# Patient Record
Sex: Male | Born: 1995 | Race: Asian | Hispanic: No | Marital: Single | Smoking: Never smoker
Health system: Southern US, Community
[De-identification: ages and names within clinical notes are randomized; demographics above are authoritative.]

---
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
## 2015-01-16 ENCOUNTER — Encounter (HOSPITAL_COMMUNITY): Payer: Self-pay | Admitting: Emergency Medicine

## 2015-01-16 ENCOUNTER — Emergency Department (HOSPITAL_COMMUNITY)
Admission: EM | Admit: 2015-01-16 | Discharge: 2015-01-16 | Disposition: A | Discharge status: Home / Self Care | Payer: Self-pay | Attending: Emergency Medicine | Admitting: Emergency Medicine

## 2015-01-16 DIAGNOSIS — L237 Allergic contact dermatitis due to plants, except food: Secondary | ICD-10-CM | POA: Insufficient documentation

## 2015-01-16 MED ORDER — PREDNISONE 20 MG PO TABS: ORAL_TABLET | ORAL | Status: AC

## 2015-01-16 MED ORDER — TRIAMCINOLONE ACETONIDE 0.025 % EX OINT: 1.0000 "application " | TOPICAL_OINTMENT | Freq: Two times a day (BID) | CUTANEOUS | Status: AC

## 2015-01-16 NOTE — Discharge Instructions (Signed)

## 2015-01-16 NOTE — ED Provider Notes (Signed)
CSN: [id number]     Arrival date & time 01/16/15  1351 History  This chart was scribed for Doug SouSam Jacubowitz, MD by Roxy Cedarhandni Bhalodia, ED Scribe. This patient was seen in room WTR8/WTR8 and the patient's care was started at 2:46 PM.   Chief Complaint  Patient presents with  . Insect Bite   Patient is a 19 y.o. male presenting with rash. The history is provided by the patient. No language interpreter was used.  Rash Location:  Shoulder/arm Shoulder/arm rash location:  R forearm Quality: blistering, draining and itchiness   Severity:  Moderate Onset quality:  Gradual Timing:  Constant Progression:  Spreading Chronicity:  New Context: plant contact   Context comment:  Poison ivy Relieved by:  Nothing Worsened by:  Nothing tried Ineffective treatments:  None tried  HPI Comments: Dustin James is a 19 y.o. male with no chronic medical conditions, who presents to the Emergency Department complaining of assumed spider bite to the back of his right arm that occurred nights ago while patient was sleeping. He states that the affected area from bite has a darkened center. Patient states that it began as a "black dot" and gradually increased in size and swelling. He reports associated itchiness. He states that he has been cleaning affected area with tea tree oil and alcohol wipes for the past 2 days. Upon evaluation, patient states that he was playing with a pet snake that is let outdoors. He states that he had wrapped the snake around his right arm which may have caused poison ivy exposure.   History reviewed. No pertinent past medical history. History reviewed. No pertinent past surgical history. History reviewed. No pertinent family history. History  Substance Use Topics  . Smoking status: Not on file  . Smokeless tobacco: Not on file  . Alcohol Use: Not on file   Review of Systems  Skin: Positive for rash.  All other systems reviewed and are negative.  Allergies  Review of patient's  allergies indicates no known allergies.  Home Medications   Prior to Admission medications   Medication Sig Start Date End Date Taking? Authorizing Provider  predniSONE (DELTASONE) 20 MG tablet 3 tabs po daily x 3 days, then 2 tabs x 3 days, then 1.5 tabs x 3 days, then 1 tab x 3 days, then 0.5 tabs x 3 days 01/16/15   Arthor CaptainAbigail Kristyn Obyrne, PA-C  triamcinolone (KENALOG) 0.025 % ointment Apply 1 application topically 2 (two) times daily. Do not apply to face 01/16/15   Arthor CaptainAbigail Evelyna Folker, PA-C   Triage Vitals: BP 105/57 mmHg  Pulse 71  Temp(Src) 98.4 F (36.9 C) (Oral)  Resp 18  SpO2 100%  Physical Exam  Constitutional: He is oriented to person, place, and time. He appears well-developed and well-nourished. No distress.  HENT:  Head: Normocephalic and atraumatic.  Neck: Normal range of motion.  Cardiovascular: Normal rate.   Pulmonary/Chest: Effort normal. No respiratory distress.  Musculoskeletal: Normal range of motion. He exhibits no edema or tenderness.  Neurological: He is alert and oriented to person, place, and time. No cranial nerve deficit. Coordination normal.  Skin: Rash noted. He is not diaphoretic. There is erythema.  Right forearm: 4cm patch of confluent vesicles with crusting and central scabbing. It is non tender to palpation. There are surrounding linear streaks of vesicular eruption. Some singular, some confluent in various states of eruption. Appears consistent with contact dermatitis.  Psychiatric: He has a normal mood and affect. His behavior is normal.  Nursing note and  vitals reviewed.  ED Course  Procedures (including critical care time)  DIAGNOSTIC STUDIES: Oxygen Saturation is 100% on RA, normal by my interpretation.    COORDINATION OF CARE: 3:06 PM- Discussed with patient that rash is due to poison ivy not spider bite. Discussed plans to prescribe patient prednisone  tablets and topical ointment. Pt advised of plan for treatment and pt agrees.  Labs  Review Labs Reviewed - No data to display  Imaging Review No results found.   EKG Interpretation None     MDM   Final diagnoses:  Poison ivy dermatitis    Patient with contact dermatitis. Prednisone. And kenalog at discharge. No secondary infection.   I personally performed the services described in this documentation, which was scribed in my presence. The recorded information has been reviewed and is accurate.     Arthor Captain, PA-C 01/24/15 1924  Doug Sou, MD 01/25/15 [id number]

## 2015-01-16 NOTE — ED Notes (Signed)
Pt states he was bitten by a spider on the back of his right arm in his sleep. Over the last two days has been cleaning area with tea tree oil and alcohol wipes. The area now has petichiae and raised irritated skin surrounding a central darkened scabbed area. Pt states it has been draining yellow fluid occasionally.

## 2015-06-01 ENCOUNTER — Encounter (HOSPITAL_COMMUNITY): Payer: Self-pay | Admitting: Emergency Medicine

## 2016-09-13 ENCOUNTER — Emergency Department (HOSPITAL_COMMUNITY)
Admission: EM | Admit: 2016-09-13 | Discharge: 2016-09-14 | Disposition: A | Discharge status: Home / Self Care | Payer: Medicaid Other | Attending: Emergency Medicine | Admitting: Emergency Medicine

## 2016-09-13 ENCOUNTER — Encounter (HOSPITAL_COMMUNITY): Payer: Self-pay | Admitting: Emergency Medicine

## 2016-09-13 DIAGNOSIS — M25561 Pain in right knee: Secondary | ICD-10-CM | POA: Diagnosis not present

## 2016-09-13 DIAGNOSIS — M25562 Pain in left knee: Secondary | ICD-10-CM | POA: Diagnosis not present

## 2016-09-13 DIAGNOSIS — S29012A Strain of muscle and tendon of back wall of thorax, initial encounter: Secondary | ICD-10-CM | POA: Diagnosis not present

## 2016-09-13 DIAGNOSIS — Y92410 Unspecified street and highway as the place of occurrence of the external cause: Secondary | ICD-10-CM | POA: Insufficient documentation

## 2016-09-13 DIAGNOSIS — S299XXA Unspecified injury of thorax, initial encounter: Secondary | ICD-10-CM | POA: Diagnosis present

## 2016-09-13 DIAGNOSIS — Y939 Activity, unspecified: Secondary | ICD-10-CM | POA: Diagnosis not present

## 2016-09-13 DIAGNOSIS — Y999 Unspecified external cause status: Secondary | ICD-10-CM | POA: Insufficient documentation

## 2016-09-13 DIAGNOSIS — S29019A Strain of muscle and tendon of unspecified wall of thorax, initial encounter: Secondary | ICD-10-CM
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
## 2016-09-14 ENCOUNTER — Emergency Department (HOSPITAL_COMMUNITY): Payer: Medicaid Other

## 2016-09-14 MED ORDER — IBUPROFEN 400 MG PO TABS
600.0000 mg | ORAL_TABLET | Freq: Once | ORAL | Status: AC
  Administered 2016-09-14: 600 mg via ORAL
  Filled 2016-09-14: qty 1

## 2016-09-14 MED ORDER — CYCLOBENZAPRINE HCL 5 MG PO TABS: 5.0000 mg | ORAL_TABLET | Freq: Two times a day (BID) | ORAL | 0 refills | Status: AC | PRN

## 2016-09-14 MED ORDER — DICLOFENAC SODIUM 50 MG PO TBEC: 50.0000 mg | DELAYED_RELEASE_TABLET | Freq: Two times a day (BID) | ORAL | 0 refills | Status: AC

## 2016-09-14 NOTE — Discharge Instructions (Signed)
Do not drive while taking the muscle relaxant as it will make you sleepy. °
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
## 2016-09-14 NOTE — ED Notes (Signed)
Patient transported to X-ray 

## 2016-09-14 NOTE — ED Notes (Signed)
Pt departed in NAD, refused use of a wheelchair.  

## 2016-09-14 NOTE — ED Notes (Signed)
Returned from  X-ray
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
